# Patient Record
Sex: Male | Born: 1990 | Race: White | Hispanic: No | Marital: Single
Health system: Southern US, Community
[De-identification: ages and names within clinical notes are randomized; demographics above are authoritative.]

## PROBLEM LIST (undated history)

## (undated) HISTORY — PX: TYMPANOSTOMY TUBE PLACEMENT: SHX32

## (undated) HISTORY — PX: WRIST ARTHROSCOPY: SUR100

## (undated) HISTORY — PX: TONSILLECTOMY: SUR1361

## (undated) HISTORY — PX: ADENOIDECTOMY: SUR15

---
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
## 2012-08-26 ENCOUNTER — Emergency Department: Payer: Self-pay | Admitting: Internal Medicine

## 2012-08-26 LAB — URINALYSIS, COMPLETE
Bilirubin,UR: NEGATIVE
Glucose,UR: NEGATIVE mg/dL (ref 0–75)
Ketone: NEGATIVE
Protein: 100
Squamous Epithelial: NONE SEEN
WBC UR: 5 /HPF (ref 0–5)

## 2012-08-26 LAB — CBC
HCT: 45.4 % (ref 40.0–52.0)
MCH: 29.9 pg (ref 26.0–34.0)
MCHC: 35.5 g/dL (ref 32.0–36.0)
MCV: 84 fL (ref 80–100)
Platelet: 240 10*3/uL (ref 150–440)
RBC: 5.39 10*6/uL (ref 4.40–5.90)
RDW: 13.4 % (ref 11.5–14.5)
WBC: 10.3 10*3/uL (ref 3.8–10.6)

## 2012-08-26 LAB — COMPREHENSIVE METABOLIC PANEL
Alkaline Phosphatase: 116 U/L (ref 98–317)
Anion Gap: 5 — ABNORMAL LOW (ref 7–16)
Bilirubin,Total: 0.7 mg/dL (ref 0.2–1.0)
Chloride: 107 mmol/L (ref 98–107)
Co2: 28 mmol/L (ref 21–32)
EGFR (African American): >60
Glucose: 115 mg/dL — ABNORMAL HIGH (ref 65–99)
Osmolality: 281 (ref 275–301)
Sodium: 140 mmol/L (ref 136–145)
Total Protein: 8 g/dL (ref 6.4–8.6)

## 2013-02-26 ENCOUNTER — Emergency Department: Payer: Self-pay | Admitting: Emergency Medicine

## 2013-02-26 LAB — URINALYSIS, COMPLETE
Bilirubin,UR: NEGATIVE
Ketone: NEGATIVE
Leukocyte Esterase: NEGATIVE
Protein: 100
Specific Gravity: 1.014 (ref 1.003–1.030)
Squamous Epithelial: NONE SEEN
WBC UR: 5 /HPF (ref 0–5)

## 2013-02-26 LAB — BASIC METABOLIC PANEL
Anion Gap: 1 — ABNORMAL LOW (ref 7–16)
BUN: 14 mg/dL (ref 7–18)
Calcium, Total: 9.5 mg/dL (ref 8.5–10.1)
Chloride: 106 mmol/L (ref 98–107)
Co2: 31 mmol/L (ref 21–32)
Creatinine: 0.85 mg/dL (ref 0.60–1.30)
Glucose: 98 mg/dL (ref 65–99)
Potassium: 3.8 mmol/L (ref 3.5–5.1)
Sodium: 138 mmol/L (ref 136–145)

## 2013-02-26 LAB — CBC
HCT: 44.2 % (ref 40.0–52.0)
MCH: 29.9 pg (ref 26.0–34.0)
MCHC: 35.4 g/dL (ref 32.0–36.0)
Platelet: 214 10*3/uL (ref 150–440)
RBC: 5.23 10*6/uL (ref 4.40–5.90)
WBC: 6.6 10*3/uL (ref 3.8–10.6)

## 2013-03-07 ENCOUNTER — Emergency Department: Payer: Self-pay | Admitting: Emergency Medicine

## 2013-03-07 LAB — URINALYSIS, COMPLETE
Leukocyte Esterase: NEGATIVE
Ph: 5 (ref 4.5–8.0)
RBC,UR: 146 /HPF (ref 0–5)
Squamous Epithelial: <1

## 2013-03-07 LAB — CBC
HCT: 41.5 % (ref 40.0–52.0)
HGB: 14.5 g/dL (ref 13.0–18.0)
MCHC: 34.8 g/dL (ref 32.0–36.0)
MCV: 85 fL (ref 80–100)
RBC: 4.91 10*6/uL (ref 4.40–5.90)
WBC: 12.8 10*3/uL — ABNORMAL HIGH (ref 3.8–10.6)

## 2013-08-22 ENCOUNTER — Emergency Department: Payer: Self-pay | Admitting: Emergency Medicine

## 2013-12-14 ENCOUNTER — Emergency Department: Payer: Self-pay | Admitting: Internal Medicine

## 2013-12-14 LAB — CBC
HCT: 44.5 % (ref 40.0–52.0)
HGB: 15.6 g/dL (ref 13.0–18.0)
MCH: 30.3 pg (ref 26.0–34.0)
MCHC: 35 g/dL (ref 32.0–36.0)
MCV: 86 fL (ref 80–100)
Platelet: 235 10*3/uL (ref 150–440)
RBC: 5.15 10*6/uL (ref 4.40–5.90)
RDW: 13 % (ref 11.5–14.5)
WBC: 13.7 10*3/uL — ABNORMAL HIGH (ref 3.8–10.6)

## 2013-12-14 LAB — COMPREHENSIVE METABOLIC PANEL
ALT: 62 U/L
ANION GAP: 9 (ref 7–16)
AST: 47 U/L — AB (ref 15–37)
Albumin: 3.9 g/dL (ref 3.4–5.0)
Alkaline Phosphatase: 95 U/L
BUN: 9 mg/dL (ref 7–18)
Bilirubin,Total: 0.5 mg/dL (ref 0.2–1.0)
CALCIUM: 9 mg/dL (ref 8.5–10.1)
CREATININE: 0.79 mg/dL (ref 0.60–1.30)
Chloride: 107 mmol/L (ref 98–107)
Co2: 23 mmol/L (ref 21–32)
EGFR (African American): >60
Glucose: 177 mg/dL — ABNORMAL HIGH (ref 65–99)
Osmolality: 281 (ref 275–301)
Potassium: 3.6 mmol/L (ref 3.5–5.1)
Sodium: 139 mmol/L (ref 136–145)
TOTAL PROTEIN: 7 g/dL (ref 6.4–8.2)

## 2013-12-14 LAB — LIPASE, BLOOD: LIPASE: 126 U/L (ref 73–393)

## 2013-12-29 ENCOUNTER — Ambulatory Visit: Payer: Self-pay | Admitting: Specialist

## 2014-08-04 ENCOUNTER — Emergency Department: Admit: 2014-08-04 | Discharge status: Against Medical Advice | Payer: Self-pay | Admitting: Emergency Medicine

## 2014-08-04 LAB — CBC WITH DIFFERENTIAL/PLATELET
BASOS ABS: 0 10*3/uL (ref 0.0–0.1)
Basophil %: 0.4 %
Eosinophil #: 0.2 10*3/uL (ref 0.0–0.7)
Eosinophil %: 2.5 %
HCT: 46.2 % (ref 40.0–52.0)
HGB: 15.5 g/dL (ref 13.0–18.0)
LYMPHS PCT: 17.1 %
Lymphocyte #: 1.4 10*3/uL (ref 1.0–3.6)
MCH: 29.7 pg (ref 26.0–34.0)
MCHC: 33.5 g/dL (ref 32.0–36.0)
MCV: 88 fL (ref 80–100)
MONO ABS: 0.9 x10 3/mm (ref 0.2–1.0)
Monocyte %: 10.6 %
NEUTROS PCT: 69.4 %
Neutrophil #: 5.7 10*3/uL (ref 1.4–6.5)
PLATELETS: 190 10*3/uL (ref 150–440)
RBC: 5.22 10*6/uL (ref 4.40–5.90)
RDW: 12.9 % (ref 11.5–14.5)
WBC: 8.2 10*3/uL (ref 3.8–10.6)

## 2014-08-04 LAB — COMPREHENSIVE METABOLIC PANEL
ALBUMIN: 4.5 g/dL
ALT: 40 U/L
AST: 28 U/L
Alkaline Phosphatase: 99 U/L
Anion Gap: 5 — ABNORMAL LOW (ref 7–16)
BUN: 11 mg/dL
Bilirubin,Total: 1 mg/dL
Calcium, Total: 9.4 mg/dL
Chloride: 105 mmol/L
Co2: 28 mmol/L
Creatinine: 0.9 mg/dL
EGFR (Non-African Amer.): >60
Glucose: 113 mg/dL — ABNORMAL HIGH
Potassium: 4.1 mmol/L
SODIUM: 138 mmol/L
TOTAL PROTEIN: 7.4 g/dL

## 2014-08-04 LAB — URINALYSIS, COMPLETE
BILIRUBIN, UR: NEGATIVE
Glucose,UR: NEGATIVE mg/dL (ref 0–75)
Ketone: NEGATIVE
LEUKOCYTE ESTERASE: NEGATIVE
Nitrite: NEGATIVE
PH: 5 (ref 4.5–8.0)
Protein: 100
Specific Gravity: 1.017 (ref 1.003–1.030)
Squamous Epithelial: NONE SEEN

## 2014-08-04 LAB — LIPASE, BLOOD: LIPASE: 25 U/L
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
## 2014-08-06 NOTE — Op Note (Signed)
PATIENT NAME:  David Ward, David Ward MR#:  [id number] DATE OF BIRTH:  [birth year]  DATE OF PROCEDURE:  12/29/2013  PREOPERATIVE DIAGNOSIS: Volar dislocation, left ulna at the radial ulnar joint with fractures of the distal radius and ulnar styloid.   POSTOPERATIVE DIAGNOSIS: Volar dislocation, left ulna at the radial ulnar joint with fractures of the distal radius and ulnar styloid.   OPERATION: Open reduction internal fixation left distal radius ulnar styloid and reduction of the radial ulnar dislocation (one 4.0 32 mm Synthes cannulated screw and one 26 mm 3.0 mm cannulated screw).   SURGEON: Valinda HoarHoward E. Nyasha Rahilly, M.D.   ANESTHESIA: General LMA.   COMPLICATIONS: None.   DRAINS: None.   ESTIMATED BLOOD LOSS: None.   REPLACED: None.   DESCRIPTION OF PROCEDURE: The patient was brought to the Operating Room where he underwent satisfactory general LMA anesthesia in the supine position. The left arm was prepped and draped in sterile fashion. Palpation showed that the distal ulna was displaced volarly and radially. Although the patient had good supination, he had no pronation. The distal radius had been reduced initially in the Emergency Room. A stab wound was made and a guidepin inserted. A 32 mm 4.0 cannulated screw was inserted to stabilize this fracture to prevent displacement during the remainder of the procedure. Once this was completed, a gently curved dorsal ulnar incision was made over the distal radial ulnar joint. Dissection was carried out bluntly out through subcutaneous tissue using loop magnification. Ulnar nerve branches were preserved as well as possible. Dissection was carried out radially and by palpation I could feel the shaft of the ulna, which was displaced volarly and radially. The interval between the radius and ulna proximal to the ulnar joint was opened and a large periosteal elevator was introduced. By levering this with significant force, the distal ulna suddenly popped back into  place with tremendous force. This immediately restored the contour and alignment of the wrist. Pronation and supination were restored nicely. The distal ulna was exposed and a guidepin inserted. A 26 mm 3.0 cannulated screw was inserted to stabilize the ulnar styloid. The radial ulnar joint was quite stable with rotation and anterior and posterior stressing. I did not feel that a pin was necessary through the joint. The wound was irrigated. Subcutaneous tissue was closed with 4-0 Vicryl, and the skin was closed with 4-0 nylon on both wounds. Marcaine 0.5 was placed in the wounds. A dry sterile dressing and a sugar tong splint were applied with the wrist slightly pronated. The tourniquet was deflated with good return of blood flow to the hand. The patient was awakened and taken to recovery in good condition.    ____________________________ Valinda HoarHoward E. Kymari Lollis, MD hem:at D: 12/29/2013 13:33:51 ET T: 12/29/2013 15:06:24 ET JOB#: [id number]  cc: Valinda HoarHoward E. Anona Giovannini, MD, <Dictator> Valinda HoarHOWARD E Jenyfer Trawick MD ELECTRONICALLY SIGNED 12/30/2013 10:31

## 2015-05-26 ENCOUNTER — Encounter: Payer: Self-pay | Admitting: Emergency Medicine

## 2015-05-26 ENCOUNTER — Emergency Department
Admission: EM | Admit: 2015-05-26 | Discharge: 2015-05-26 | Disposition: A | Discharge status: Home / Self Care | Payer: Self-pay | Attending: Emergency Medicine | Admitting: Emergency Medicine

## 2015-05-26 DIAGNOSIS — K529 Noninfective gastroenteritis and colitis, unspecified: Secondary | ICD-10-CM | POA: Insufficient documentation

## 2015-05-26 LAB — COMPREHENSIVE METABOLIC PANEL
ALT: 38 U/L (ref 17–63)
AST: 28 U/L (ref 15–41)
Albumin: 4.7 g/dL (ref 3.5–5.0)
Alkaline Phosphatase: 76 U/L (ref 38–126)
Anion gap: 6 (ref 5–15)
BUN: 12 mg/dL (ref 6–20)
CALCIUM: 9.2 mg/dL (ref 8.9–10.3)
CHLORIDE: 105 mmol/L (ref 101–111)
CO2: 29 mmol/L (ref 22–32)
CREATININE: 0.83 mg/dL (ref 0.61–1.24)
Glucose, Bld: 85 mg/dL (ref 65–99)
Potassium: 3.6 mmol/L (ref 3.5–5.1)
Sodium: 140 mmol/L (ref 135–145)
Total Bilirubin: 0.8 mg/dL (ref 0.3–1.2)
Total Protein: 7.6 g/dL (ref 6.5–8.1)

## 2015-05-26 LAB — CBC WITH DIFFERENTIAL/PLATELET
Basophils Absolute: 0 10*3/uL (ref 0–0.1)
Basophils Relative: 1 %
EOS PCT: 1 %
Eosinophils Absolute: 0.1 10*3/uL (ref 0–0.7)
HEMATOCRIT: 44.7 % (ref 40.0–52.0)
Hemoglobin: 15.2 g/dL (ref 13.0–18.0)
LYMPHS ABS: 3 10*3/uL (ref 1.0–3.6)
LYMPHS PCT: 32 %
MCH: 29.1 pg (ref 26.0–34.0)
MCHC: 34.1 g/dL (ref 32.0–36.0)
MCV: 85.4 fL (ref 80.0–100.0)
MONO ABS: 0.7 10*3/uL (ref 0.2–1.0)
Monocytes Relative: 8 %
NEUTROS ABS: 5.4 10*3/uL (ref 1.4–6.5)
Neutrophils Relative %: 58 %
PLATELETS: 235 10*3/uL (ref 150–440)
RBC: 5.23 MIL/uL (ref 4.40–5.90)
RDW: 13 % (ref 11.5–14.5)
WBC: 9.2 10*3/uL (ref 3.8–10.6)

## 2015-05-26 MED ORDER — ONDANSETRON 4 MG PO TBDP
4.0000 mg | ORAL_TABLET | Freq: Once | ORAL | Status: AC
  Administered 2015-05-26: 4 mg via ORAL
  Filled 2015-05-26: qty 1

## 2015-05-26 MED ORDER — LOPERAMIDE HCL 2 MG PO CAPS
4.0000 mg | ORAL_CAPSULE | Freq: Once | ORAL | Status: AC
  Administered 2015-05-26: 4 mg via ORAL
  Filled 2015-05-26 (×2): qty 2

## 2015-05-26 MED ORDER — PROMETHAZINE HCL 25 MG PO TABS
25.0000 mg | ORAL_TABLET | Freq: Once | ORAL | Status: AC
  Administered 2015-05-26: 25 mg via ORAL
  Filled 2015-05-26: qty 1

## 2015-05-26 MED ORDER — ONDANSETRON HCL 4 MG PO TABS: 4.0000 mg | ORAL_TABLET | Freq: Every day | ORAL | Status: DC | PRN

## 2015-05-26 NOTE — ED Provider Notes (Signed)
[facility] Emergency Department Provider Note     Time seen: ----------------------------------------- 4:59 PM on 05/26/2015 -----------------------------------------    I have reviewed the triage vital signs and the nursing notes.   HISTORY  Chief Complaint Emesis    HPI David Ward is a 25 y.o. male who presents ER with nausea vomiting and diarrhea since Monday. Patient states she really hasn't kept anything down since yesterday, denies any specific abdominal pain, denies history of same. Nothing makes his symptoms better, eating makes his symptoms worse.   History reviewed. No pertinent past medical history.  There are no active problems to display for this patient.   History reviewed. No pertinent past surgical history.  Allergies Review of patient's allergies indicates no known allergies.  Social History Social History  Substance Use Topics  . Smoking status: Never Smoker   . Smokeless tobacco: None  . Alcohol Use: None    Review of Systems Constitutional: Negative for fever. Eyes: Negative for visual changes. ENT: Negative for sore throat. Cardiovascular: Negative for chest pain. Respiratory: Negative for shortness of breath. Gastrointestinal: Positive for abdominal pain, vomiting and diarrhea Genitourinary: Negative for dysuria. Musculoskeletal: Negative for back pain. Skin: Negative for rash. Neurological: Negative for headaches, focal weakness or numbness.  10-point ROS otherwise negative.  ____________________________________________   PHYSICAL EXAM:  VITAL SIGNS: ED Triage Vitals  Enc Vitals Group     BP 05/26/15 1547 143/75 mmHg     Pulse Rate 05/26/15 1547 60     Resp 05/26/15 1547 18     Temp 05/26/15 1547 98.5 F (36.9 C)     Temp Source 05/26/15 1547 Oral     SpO2 05/26/15 1547 99 %     Weight 05/26/15 1547 287 lb (130.182 kg)     Height 05/26/15 1547  (1.803 m)     Head Cir --      Peak  Flow --      Pain Score 05/26/15 1547 5     Pain Loc --      Pain Edu? --      Excl. in GC? --     Constitutional: Alert and oriented. Well appearing and in no distress. Eyes: Conjunctivae are normal. PERRL. Normal extraocular movements. ENT   Head: Normocephalic and atraumatic.   Nose: No congestion/rhinnorhea.   Mouth/Throat: Mucous membranes are moist.   Neck: No stridor. Cardiovascular: Normal rate, regular rhythm. Normal and symmetric distal pulses are present in all extremities. No murmurs, rubs, or gallops. Respiratory: Normal respiratory effort without tachypnea nor retractions. Breath sounds are clear and equal bilaterally. No wheezes/rales/rhonchi. Gastrointestinal: Soft and nontender. No distention. No abdominal bruits.  Musculoskeletal: Nontender with normal range of motion in all extremities. No joint effusions.  No lower extremity tenderness nor edema. Neurologic:  Normal speech and language. No gross focal neurologic deficits are appreciated. Speech is normal. No gait instability. Skin:  Skin is warm, dry and intact. No rash noted. Psychiatric: Mood and affect are normal. Speech and behavior are normal. Patient exhibits appropriate insight and judgment. ____________________________________________  ED COURSE:  Pertinent labs & imaging results that were available during my care of the patient were reviewed by me and considered in my medical decision making (see chart for details). Patient's distress, likely Norovirus. He'll be given oral antiemetics and antidiarrheal agents. ____________________________________________    LABS (pertinent positives/negatives)  Labs Reviewed  CBC WITH DIFFERENTIAL/PLATELET  COMPREHENSIVE METABOLIC PANEL   ____________________________________________  FINAL ASSESSMENT AND PLAN  Gastroenteritis  Plan: Patient with labs and imaging as dictated above. Patient be discharged with antiemetics and encouraged to have close  follow-up with his doctor for reevaluation.   Emily Filbert, MD   Emily Filbert, MD 05/26/15 [phone]

## 2015-05-26 NOTE — ED Notes (Signed)
Reports n/v since Monday.  Skin w/d with good color

## 2015-05-26 NOTE — Discharge Instructions (Signed)
Norovirus Infection °A norovirus infection is caused by exposure to a virus in a group of similar viruses (noroviruses). This type of infection causes inflammation in your stomach and intestines (gastroenteritis). Norovirus is the most common cause of gastroenteritis. It also causes food poisoning. °Anyone can get a norovirus infection. It spreads very easily (contagious). You can get it from contaminated food, water, surfaces, or other people. Norovirus is found in the stool or vomit of infected people. You can spread the infection as soon as you feel sick until 2 weeks after you recover.  °Symptoms usually begin within 2 days after you become infected. Most norovirus symptoms affect the digestive system. °CAUSES °Norovirus infection is caused by contact with norovirus. You can catch norovirus if you: °· Eat or drink something contaminated with norovirus. °· Touch surfaces or objects contaminated with norovirus and then put your hand in your mouth. °· Have direct contact with an infected person who has symptoms. °· Share food, drink, or utensils with someone with who is sick with norovirus. °SIGNS AND SYMPTOMS °Symptoms of norovirus may include: °· Nausea. °· Vomiting. °· Diarrhea. °· Stomach cramps. °· Fever. °· Chills. °· Headache. °· Muscle aches. °· Tiredness. °DIAGNOSIS °Your health care provider may suspect norovirus based on your symptoms and physical exam. Your health care provider may also test a sample of your stool or vomit for the virus.  °TREATMENT °There is no specific treatment for norovirus. Most people get better without treatment in about 2 days. °HOME CARE INSTRUCTIONS °· Replace lost fluids by drinking plenty of water or rehydration fluids containing important minerals called electrolytes. This prevents dehydration. Drink enough fluid to keep your urine clear or pale yellow. °· Do not prepare food for others while you are infected. Wait at least 3 days after recovering from the illness to do  that. °PREVENTION  °· Wash your hands often, especially after using the toilet or changing a diaper. °· Wash fruits and vegetables thoroughly before preparing or serving them. °· Throw out any food that a sick person may have touched. °· Disinfect contaminated surfaces immediately after someone in the household has been sick. Use a bleach-based household cleaner. °· Immediately remove and wash soiled clothes or sheets. °SEEK MEDICAL CARE IF: °· Your vomiting, diarrhea, and stomach pain is getting worse. °· Your symptoms of norovirus do not go away after 2-3 days. °SEEK IMMEDIATE MEDICAL CARE IF:  °You develop symptoms of dehydration that do not improve with fluid replacement. This may include: °· Excessive sleepiness. °· Lack of tears. °· Dry mouth. °· Dizziness when standing. °· Weak pulse. °  °This information is not intended to replace advice given to you by your health care provider. Make sure you discuss any questions you have with your health care provider. °  °Document Released: 06/22/2002 Document Revised: 04/22/2014 Document Reviewed: 09/09/2013 °Elsevier Interactive Patient Education ©2016 [employer]. ° °

## 2015-10-04 ENCOUNTER — Encounter: Payer: Self-pay | Admitting: Occupational Medicine

## 2015-10-04 ENCOUNTER — Emergency Department
Admission: EM | Admit: 2015-10-04 | Discharge: 2015-10-04 | Disposition: A | Discharge status: Home / Self Care | Payer: Self-pay | Attending: Emergency Medicine | Admitting: Emergency Medicine

## 2015-10-04 DIAGNOSIS — L600 Ingrowing nail: Secondary | ICD-10-CM

## 2015-10-04 MED ORDER — MUPIROCIN 2 % EX OINT: TOPICAL_OINTMENT | CUTANEOUS | Status: AC

## 2015-10-04 MED ORDER — OXYCODONE-ACETAMINOPHEN 5-325 MG PO TABS: 1.0000 | ORAL_TABLET | ORAL | Status: DC | PRN

## 2015-10-04 MED ORDER — CEPHALEXIN 500 MG PO CAPS: 500.0000 mg | ORAL_CAPSULE | Freq: Four times a day (QID) | ORAL | Status: AC

## 2015-10-04 NOTE — ED Notes (Signed)
Pt presents with in grown toe nail for a month right great toe. Drainage purulent and foul odor and swelling noted. Pt reports severe pain when applying pressure to walk.

## 2015-10-04 NOTE — ED Notes (Signed)
Discharge instructions reviewed with patient. Questions fielded by this RN. Patient verbalizes understanding of instructions. Patient discharged home in stable condition per Beers PA. No acute distress noted at time of discharge.   

## 2015-10-04 NOTE — ED Provider Notes (Signed)
[facility] Emergency Department Provider Note  ____________________________________________  Time seen: Approximately 9:52 PM  I have reviewed the triage vital signs and the nursing notes.   HISTORY  Chief Complaint Toe Pain    HPI David Ward is a 25 y.o. male who presents for evaluation of ingrown toenail for one month. Patient states that he had some purulent drainage and foul are coming from his right great toenail. Desires referral to podiatry. Voices no other complaints at this time. Denies any trauma or injury.   History reviewed. No pertinent past medical history.  There are no active problems to display for this patient.   Past Surgical History  Procedure Laterality Date  . Tonsillectomy    . Tympanostomy tube placement    . Adenoidectomy    . Wrist arthroscopy      right    Current Outpatient Rx  Name  Route  Sig  Dispense  Refill  . cephALEXin (KEFLEX) 500 MG capsule   Oral   Take 1 capsule (500 mg total) by mouth 4 (four) times daily.   40 capsule   0   . mupirocin ointment (BACTROBAN) 2 %      Apply to affected area 3 times daily   22 g   0   . ondansetron (ZOFRAN) 4 MG tablet   Oral   Take 1 tablet (4 mg total) by mouth daily as needed for nausea or vomiting.   20 tablet   1   . oxyCODONE-acetaminophen (ROXICET) 5-325 MG tablet   Oral   Take 1-2 tablets by mouth every 4 (four) hours as needed for severe pain.   15 tablet   0     Allergies Review of patient's allergies indicates no known allergies.  No family history on file.  Social History Social History  Substance Use Topics  . Smoking status: Never Smoker   . Smokeless tobacco: None  . Alcohol Use: Yes     Comment: occ    Review of Systems Constitutional: No fever/chills Cardiovascular: Denies chest pain. Respiratory: Denies shortness of breath. Musculoskeletal: Negative for back pain. Skin: Positive for erythematous swollen right great  toe on the medial aspect. Neurological: Negative for headaches, focal weakness or numbness.  10-point ROS otherwise negative.  ____________________________________________   PHYSICAL EXAM:  VITAL SIGNS: ED Triage Vitals  Enc Vitals Group     BP 10/04/15 2125 140/70 mmHg     Pulse Rate 10/04/15 2125 83     Resp --      Temp 10/04/15 2125 98.1 F (36.7 C)     Temp Source 10/04/15 2125 Oral     SpO2 10/04/15 2125 97 %     Weight 10/04/15 2125 238 lb (107.956 kg)     Height 10/04/15 2125 5\' 11"  (1.803 m)     Head Cir --      Peak Flow --      Pain Score 10/04/15 2125 6     Pain Loc --      Pain Edu? --      Excl. in GC? --     Constitutional: Alert and oriented. Well appearing and in no acute distress. Cardiovascular: Normal rate, regular rhythm. Grossly normal heart sounds.  Good peripheral circulation. Respiratory: Normal respiratory effort.  No retractions. Lungs CTAB. Musculoskeletal: No lower extremity tenderness nor edema.  No joint effusions. Neurologic:  Normal speech and language. No gross focal neurologic deficits are appreciated. No gait instability. Skin:  Right great toe  with swelling purulent drainage noted on the medial aspect of the toe. Positive tenderness. No active pus coming from the toe. Psychiatric: Mood and affect are normal. Speech and behavior are normal.  ____________________________________________   LABS (all labs ordered are listed, but only abnormal results are displayed)  Labs Reviewed - No data to display ____________________________________________    PROCEDURES  Procedure(s) performed: None  Critical Care performed: No  ____________________________________________   INITIAL IMPRESSION / ASSESSMENT AND PLAN / ED COURSE  Pertinent labs & imaging results that were available during my care of the patient were reviewed by me and considered in my medical decision making (see chart for details).  Ingrown toenail. Rx given for Keflex  500 mg 4 times a day, Bactroban ointment and Percocet for pain. Patient follow-up with Dr. Orland Jarred podiatry on-call. He voices no other emergency medical complaints at this time. ____________________________________________   FINAL CLINICAL IMPRESSION(S) / ED DIAGNOSES  Final diagnoses:  Ingrown right greater toenail     This chart was dictated using voice recognition software/Dragon. Despite best efforts to proofread, errors can occur which can change the meaning. Any change was purely unintentional.   Evangeline Dakin, PA-C 10/04/15 2158  Governor Rooks, MD 10/04/15 2332

## 2015-10-04 NOTE — Discharge Instructions (Signed)
Ingrown Toenail  An ingrown toenail occurs when the corner or sides of your toenail grow into the surrounding skin. The big toe is most commonly affected, but it can happen to any of your toes. If your ingrown toenail is not treated, you will be at risk for infection.  CAUSES  This condition may be caused by:  · Wearing shoes that are too small or tight.  · Injury or trauma, such as stubbing your toe or having your toe stepped on.  · Improper cutting or care of your toenails.  · Being born with (congenital) nail or foot abnormalities, such as having a nail that is too big for your toe.  RISK FACTORS  Risk factors for an ingrown toenail include:  · Age. Your nails tend to thicken as you get older, so ingrown nails are more common in older people.  · Diabetes.  · Cutting your toenails incorrectly.  · Blood circulation problems.  SYMPTOMS  Symptoms may include:  · Pain, soreness, or tenderness.  · Redness.  · Swelling.  · Hardening of the skin surrounding the toe.  Your ingrown toenail may be infected if there is fluid, pus, or drainage.  DIAGNOSIS   An ingrown toenail may be diagnosed by medical history and physical exam. If your toenail is infected, your health care provider may test a sample of the drainage.  TREATMENT  Treatment depends on the severity of your ingrown toenail. Some ingrown toenails may be treated at home. More severe or infected ingrown toenails may require surgery to remove all or part of the nail. Infected ingrown toenails may also be treated with antibiotic medicines.  HOME CARE INSTRUCTIONS  · If you were prescribed an antibiotic medicine, finish all of it even if you start to feel better.  · Soak your foot in warm soapy water for 20 minutes, 3 times per day or as directed by your health care provider.  · Carefully lift the edge of the nail away from the sore skin by wedging a small piece of cotton under the corner of the nail. This may help with the pain.  Be careful not to cause more injury  to the area.  · Wear shoes that fit well. If your ingrown toenail is causing you pain, try wearing sandals, if possible.  · Trim your toenails regularly and carefully. Do not cut them in a curved shape. Cut your toenails straight across. This prevents injury to the skin at the corners of the toenail.  · Keep your feet clean and dry.  · If you are having trouble walking and are given crutches by your health care provider, use them as directed.  · Do not pick at your toenail or try to remove it yourself.  · Take medicines only as directed by your health care provider.  · Keep all follow-up visits as directed by your health care provider. This is important.  SEEK MEDICAL CARE IF:  · Your symptoms do not improve with treatment.  SEEK IMMEDIATE MEDICAL CARE IF:  · You have red streaks that start at your foot and go up your leg.  · You have a fever.  · You have increased redness, swelling, or pain.  · You have fluid, blood, or pus coming from your toenail.     This information is not intended to replace advice given to you by your health care provider. Make sure you discuss any questions you have with your health care provider.     Document Released:   03/29/2000 Document Revised: 08/16/2014 Document Reviewed: 02/23/2014  Elsevier Interactive Patient Education ©2016 [employer]

## 2016-03-12 ENCOUNTER — Encounter: Payer: Self-pay | Admitting: Emergency Medicine

## 2016-03-12 ENCOUNTER — Emergency Department
Admission: EM | Admit: 2016-03-12 | Discharge: 2016-03-12 | Disposition: A | Discharge status: Home / Self Care | Payer: 59 | Attending: Emergency Medicine | Admitting: Emergency Medicine

## 2016-03-12 DIAGNOSIS — R197 Diarrhea, unspecified: Secondary | ICD-10-CM | POA: Insufficient documentation

## 2016-03-12 DIAGNOSIS — J069 Acute upper respiratory infection, unspecified: Secondary | ICD-10-CM | POA: Insufficient documentation

## 2016-03-12 DIAGNOSIS — R05 Cough: Secondary | ICD-10-CM | POA: Diagnosis present

## 2016-03-12 LAB — POCT RAPID STREP A: Streptococcus, Group A Screen (Direct): NEGATIVE

## 2016-03-12 MED ORDER — CETIRIZINE HCL 10 MG PO TABS: 10.0000 mg | ORAL_TABLET | Freq: Every day | ORAL | 0 refills | Status: DC

## 2016-03-12 MED ORDER — PSEUDOEPH-BROMPHEN-DM 30-2-10 MG/5ML PO SYRP: 10.0000 mL | ORAL_SOLUTION | Freq: Four times a day (QID) | ORAL | 0 refills | Status: DC | PRN

## 2016-03-12 MED ORDER — FLUTICASONE PROPIONATE 50 MCG/ACT NA SUSP: 1.0000 | Freq: Two times a day (BID) | NASAL | 0 refills | Status: DC

## 2016-03-12 NOTE — ED Provider Notes (Signed)
[facility] Emergency Department Provider Note  ____________________________________________  Time seen: Approximately 5:41 PM  I have reviewed the triage vital signs and the nursing notes.   HISTORY  Chief Complaint Sore Throat and Nasal Congestion    HPI David Ward is a 25 y.o. male who presents to emergency department for a 5 day history of nasal congestion, sore throat, coughing, diarrhea. Patient states that symptoms began insidiously with nasal congestion and progressed to include sore throat, coughing, and diarrhea. Patient states that he is tried some Tylenol Cold and flu medication with only minimal relief. Patient denies any headache, visual changes, neck pain, chest pain, shortness of breath, abdominal pain, constipation. Patient states that he has had 5 episodes of diarrhea over 2 days. Patient denies any fevers or chills. He denies any blood in diarrhea. No complaints of this time.   History reviewed. No pertinent past medical history.  There are no active problems to display for this patient.   Past Surgical History:  Procedure Laterality Date  . ADENOIDECTOMY    . TONSILLECTOMY    . TYMPANOSTOMY TUBE PLACEMENT    . WRIST ARTHROSCOPY     right    Prior to Admission medications   Medication Sig Start Date End Date Taking? Authorizing Provider  brompheniramine-pseudoephedrine-DM 30-2-10 MG/5ML syrup Take 10 mLs by mouth 4 (four) times daily as needed. 03/12/16   Delorise Royals Lisanne Ponce, PA-C  cetirizine (ZYRTEC) 10 MG tablet Take 1 tablet (10 mg total) by mouth daily. 03/12/16   Delorise Royals Courtny Bennison, PA-C  fluticasone (FLONASE) 50 MCG/ACT nasal spray Place 1 spray into both nostrils 2 (two) times daily. 03/12/16   Delorise Royals Izabell Schalk, PA-C  mupirocin ointment (BACTROBAN) 2 % Apply to affected area 3 times daily 10/04/15 10/03/16  Charmayne Sheer Beers, PA-C  ondansetron (ZOFRAN) 4 MG tablet Take 1 tablet (4 mg total) by mouth daily as needed  for nausea or vomiting. 05/26/15   Emily Filbert, MD  oxyCODONE-acetaminophen (ROXICET) 5-325 MG tablet Take 1-2 tablets by mouth every 4 (four) hours as needed for severe pain. 10/04/15   Evangeline Dakin, PA-C    Allergies Patient has no known allergies.  No family history on file.  Social History Social History  Substance Use Topics  . Smoking status: Never Smoker  . Smokeless tobacco: Never Used  . Alcohol use Yes     Comment: occ     Review of Systems  Constitutional: No fever/chills Eyes: No visual changes. No discharge [id number] for nasal congestion and sore throat Cardiovascular: no chest pain. Respiratory: Positive cough. No SOB. Gastrointestinal: No abdominal pain.  No nausea, no vomiting.  Positive diarrhea.  No constipation. Musculoskeletal: Negative for musculoskeletal pain. Skin: Negative for rash, abrasions, lacerations, ecchymosis. Neurological: Negative for headaches, focal weakness or numbness. 10-point ROS otherwise negative.  ____________________________________________   PHYSICAL EXAM:  VITAL SIGNS: ED Triage Vitals  Enc Vitals Group     BP 03/12/16 1659 (!) 170/83     Pulse Rate 03/12/16 1659 91     Resp 03/12/16 1659 18     Temp 03/12/16 1659 98.3 F (36.8 C)     Temp Source 03/12/16 1659 Oral     SpO2 03/12/16 1659 97 %     Weight 03/12/16 1700 300 lb (136.1 kg)     Height 03/12/16 1700 5\' 11"  (1.803 m)     Head Circumference --      Peak Flow --      Pain Score  03/12/16 1700 2     Pain Loc --      Pain Edu? --      Excl. in GC? --      Constitutional: Alert and oriented. Well appearing and in no acute distress. Eyes: Conjunctivae are normal. PERRL. EOMI. Head: Atraumatic. ENT:      Ears: EACs and TMs are unremarkable bilaterally.      Nose: Positive for clear congestion/rhinnorhea.      Mouth/Throat: Mucous membranes are moist. Oropharynx is mildly erythematous but not edematous. Uvula is midline. Tonsils are mildly  erythematous but nonedematous and no exudates present. Neck: No stridor. Neck is supple with full range of motion Hematological/Lymphatic/Immunilogical: No cervical lymphadenopathy. Cardiovascular: Normal rate, regular rhythm. Normal S1 and S2.  Good peripheral circulation. Respiratory: Normal respiratory effort without tachypnea or retractions. Lungs CTAB. Good air entry to the bases with no decreased or absent breath sounds. Gastrointestinal: Bowel sounds 4 quadrants. Soft and nontender to palpation. No guarding or rigidity. No palpable masses. No distention.  Musculoskeletal: Full range of motion to all extremities. No gross deformities appreciated. Neurologic:  Normal speech and language. No gross focal neurologic deficits are appreciated.  Skin:  Skin is warm, dry and intact. No rash noted. Psychiatric: Mood and affect are normal. Speech and behavior are normal. Patient exhibits appropriate insight and judgement.   ____________________________________________   LABS (all labs ordered are listed, but only abnormal results are displayed)  Labs Reviewed  CULTURE, GROUP A STREP [facility])  POCT RAPID STREP A   ____________________________________________  EKG   ____________________________________________  RADIOLOGY   No results found.  ____________________________________________    PROCEDURES  Procedure(s) performed:    Procedures    Medications - No data to display   ____________________________________________   INITIAL IMPRESSION / ASSESSMENT AND PLAN / ED COURSE  Pertinent labs & imaging results that were available during my care of the patient were reviewed by me and considered in my medical decision making (see chart for details).  Review of the [place] CSRS was performed in accordance of the [facility] prior to dispensing any controlled drugs.  Clinical Course     Patient's diagnosis is consistent with Probable respiratory infection with diarrhea. Patient had  a negative strep test emergency department. Exam is reassuring. A shunt has symptoms consistent with viral illness. At this time, there is no indication for further labs or imaging.. Patient will be discharged home with prescriptions for Flonase, Zyrtec, cough syrup for symptomatic control. Patient is to follow up with primary care as needed or otherwise directed. Patient is given ED precautions to return to the ED for any worsening or new symptoms.     ____________________________________________  FINAL CLINICAL IMPRESSION(S) / ED DIAGNOSES  Final diagnoses:  Viral upper respiratory tract infection  Diarrhea, unspecified type      NEW MEDICATIONS STARTED DURING THIS VISIT:  New Prescriptions   BROMPHENIRAMINE-PSEUDOEPHEDRINE-DM 30-2-10 MG/5ML SYRUP    Take 10 mLs by mouth 4 (four) times daily as needed.   CETIRIZINE (ZYRTEC) 10 MG TABLET    Take 1 tablet (10 mg total) by mouth daily.   FLUTICASONE (FLONASE) 50 MCG/ACT NASAL SPRAY    Place 1 spray into both nostrils 2 (two) times daily.        This chart was dictated using voice recognition software/Dragon. Despite best efforts to proofread, errors can occur which can change the meaning. Any change was purely unintentional.    Racheal PatchesJonathan D Mairyn Lenahan, PA-C 03/12/16 1755    Phineas SemenGraydon Goodman,  MD 03/12/16 [id number]

## 2016-03-12 NOTE — ED Triage Notes (Signed)
Patient presents to the ED with cough, congestion, sneezing, and sore throat.  Patient states, "when I sneeze, my whole body shudders and my throat really hurts."  Patient reports hoarse voice yesterday.  Patient's breathing is even and not labored at this time.  Patient is in no obvious distress at this time.

## 2016-03-12 NOTE — ED Notes (Signed)
Pt reports sinus congestion with sore throat since Thursday - afebrile - denies nausea - vomited 1 time in 24 hours - 5 loose stools in the last 24 hours - c/o sinus pressure and headache assoc. With the pressure

## 2016-03-15 LAB — CULTURE, GROUP A STREP (THRC)
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
## 2017-04-27 ENCOUNTER — Other Ambulatory Visit: Payer: Self-pay

## 2017-04-27 ENCOUNTER — Emergency Department
Admission: EM | Admit: 2017-04-27 | Discharge: 2017-04-27 | Disposition: A | Discharge status: Home / Self Care | Payer: 59 | Attending: Emergency Medicine | Admitting: Emergency Medicine

## 2017-04-27 ENCOUNTER — Encounter: Payer: Self-pay | Admitting: Emergency Medicine

## 2017-04-27 DIAGNOSIS — R112 Nausea with vomiting, unspecified: Secondary | ICD-10-CM

## 2017-04-27 DIAGNOSIS — R197 Diarrhea, unspecified: Secondary | ICD-10-CM | POA: Insufficient documentation

## 2017-04-27 DIAGNOSIS — R109 Unspecified abdominal pain: Secondary | ICD-10-CM | POA: Insufficient documentation

## 2017-04-27 LAB — COMPREHENSIVE METABOLIC PANEL
ALT: 46 U/L (ref 17–63)
AST: 29 U/L (ref 15–41)
Albumin: 4.5 g/dL (ref 3.5–5.0)
Alkaline Phosphatase: 84 U/L (ref 38–126)
Anion gap: 9 (ref 5–15)
BILIRUBIN TOTAL: 0.8 mg/dL (ref 0.3–1.2)
BUN: 8 mg/dL (ref 6–20)
CALCIUM: 9.7 mg/dL (ref 8.9–10.3)
CO2: 27 mmol/L (ref 22–32)
Chloride: 104 mmol/L (ref 101–111)
Creatinine, Ser: 0.69 mg/dL (ref 0.61–1.24)
GFR calc Af Amer: >60 mL/min (ref >60)
Glucose, Bld: 125 mg/dL — ABNORMAL HIGH (ref 65–99)
POTASSIUM: 3.8 mmol/L (ref 3.5–5.1)
Sodium: 140 mmol/L (ref 135–145)
TOTAL PROTEIN: 7.7 g/dL (ref 6.5–8.1)

## 2017-04-27 LAB — LIPASE, BLOOD: Lipase: 32 U/L (ref 11–51)

## 2017-04-27 LAB — URINALYSIS, COMPLETE (UACMP) WITH MICROSCOPIC
BACTERIA UA: NONE SEEN
BILIRUBIN URINE: NEGATIVE
GLUCOSE, UA: NEGATIVE mg/dL
Hgb urine dipstick: NEGATIVE
Ketones, ur: NEGATIVE mg/dL
LEUKOCYTES UA: NEGATIVE
NITRITE: NEGATIVE
Protein, ur: NEGATIVE mg/dL
SPECIFIC GRAVITY, URINE: 1.014 (ref 1.005–1.030)
Squamous Epithelial / LPF: NONE SEEN
pH: 6 (ref 5.0–8.0)

## 2017-04-27 LAB — GASTROINTESTINAL PANEL BY PCR, STOOL (REPLACES STOOL CULTURE)

## 2017-04-27 LAB — CBC
HEMATOCRIT: 46 % (ref 40.0–52.0)
Hemoglobin: 15.8 g/dL (ref 13.0–18.0)
MCH: 29.5 pg (ref 26.0–34.0)
MCHC: 34.3 g/dL (ref 32.0–36.0)
MCV: 86 fL (ref 80.0–100.0)
Platelets: 226 10*3/uL (ref 150–440)
RBC: 5.34 MIL/uL (ref 4.40–5.90)
RDW: 13.5 % (ref 11.5–14.5)
WBC: 10.1 10*3/uL (ref 3.8–10.6)

## 2017-04-27 LAB — C DIFFICILE QUICK SCREEN W PCR REFLEX
C Diff antigen: NEGATIVE
C Diff interpretation: NOT DETECTED
C Diff toxin: NEGATIVE

## 2017-04-27 LAB — INFLUENZA PANEL BY PCR (TYPE A & B)
INFLAPCR: NEGATIVE
Influenza B By PCR: NEGATIVE

## 2017-04-27 MED ORDER — DIPHENOXYLATE-ATROPINE 2.5-0.025 MG PO TABS
1.0000 | ORAL_TABLET | Freq: Once | ORAL | Status: AC
  Administered 2017-04-27: 1 via ORAL
  Filled 2017-04-27: qty 1

## 2017-04-27 MED ORDER — ONDANSETRON 4 MG PO TBDP: 4.0000 mg | ORAL_TABLET | Freq: Three times a day (TID) | ORAL | 0 refills | Status: DC | PRN

## 2017-04-27 MED ORDER — ONDANSETRON 4 MG PO TBDP
4.0000 mg | ORAL_TABLET | Freq: Once | ORAL | Status: AC
  Administered 2017-04-27: 4 mg via ORAL
  Filled 2017-04-27: qty 1

## 2017-04-27 NOTE — ED Provider Notes (Signed)
[facility] Emergency Department Provider Note  ____________________________________________  Time seen: Approximately 6:56 PM  I have reviewed the triage vital signs and the nursing notes.   HISTORY  Chief Complaint Nausea; Emesis; and Diarrhea    HPI David Ward is a 27 y.o. male, otherwise healthy, presenting with nausea vomiting and diarrhea, abdominal cramping.  The patient reports that for the past week, he has had nonbloody, occasionally watery but mostly loose stool.  This can be associated with diffuse nonfocal cramping.  Today, the patient has had at least 8 episodes.  Over the past week, he is also had multiple episodes of nausea and vomiting, although he has not had any today and has been able to tolerate water without difficulty.  The patient has not had any documented fevers, but has woken up with shaking chills and sweatiness at night.  He has not had any cough or weight loss.  He has not had any travel outside the [place], or been camping, no recent antibiotic use.  He has not tried anything for his symptoms.  History reviewed. No pertinent past medical history.  There are no active problems to display for this patient.   Past Surgical History:  Procedure Laterality Date  . ADENOIDECTOMY    . TONSILLECTOMY    . TYMPANOSTOMY TUBE PLACEMENT    . WRIST ARTHROSCOPY     right    Current Outpatient Rx  . Order #: [id number] Class: Print  . Order #: [id number] Class: Print  . Order #: [id number] Class: Print  . Order #: [id number] Class: Print  . Order #: [id number] Class: Print  . Order #: [id number] Class: Print    Allergies Tramadol  History reviewed. No pertinent family history.  Social History Social History   Tobacco Use  . Smoking status: Never Smoker  . Smokeless tobacco: Never Used  Substance Use Topics  . Alcohol use: Yes    Comment: occ  . Drug use: No    Review of Systems Constitutional: Positive sweatiness with  chills.  Positive general malaise. Eyes: No visual changes.  No eye discharge. ENT: No sore throat. No congestion or rhinorrhea. Cardiovascular: Denies chest pain. Denies palpitations. Respiratory: Denies shortness of breath.  No cough. Gastrointestinal: Diffuse nonfocal abdominal cramping.  No nausea, no vomiting.  Positive diarrhea.  No constipation. Genitourinary: Negative for dysuria.  No urinary frequency. Musculoskeletal: Negative for back pain. Skin: Negative for rash. Neurological: Negative for headaches. No focal numbness, tingling or weakness.     ____________________________________________   PHYSICAL EXAM:  VITAL SIGNS: ED Triage Vitals  Enc Vitals Group     BP 04/27/17 1655 (!) 146/78     Pulse Rate 04/27/17 1655 66     Resp 04/27/17 1655 16     Temp 04/27/17 1655 98.9 F (37.2 C)     Temp Source 04/27/17 1655 Oral     SpO2 04/27/17 1655 96 %     Weight 04/27/17 1656 300 lb (136.1 kg)     Height 04/27/17 1656 6' (1.829 m)     Head Circumference --      Peak Flow --      Pain Score --      Pain Loc --      Pain Edu? --      Excl. in GC? --     Constitutional: Alert and oriented. Well appearing and in no acute distress. Answers questions appropriately. Eyes: Conjunctivae are normal.  EOMI. No scleral icterus. Head: Atraumatic. Nose: No congestion/rhinnorhea. Mouth/Throat: Mucous  membranes are dry.  Neck: No stridor.  Supple.  No JVD.  No meningismus. Cardiovascular: Normal rate, regular rhythm. No murmurs, rubs or gallops.  Respiratory: Normal respiratory effort.  No accessory muscle use or retractions. Lungs CTAB.  No wheezes, rales or ronchi. Gastrointestinal: Soft, and nondistended.  Morbidly obese.  No tenderness to palpation on my examination.  No guarding or rebound.  No peritoneal signs. Musculoskeletal: No LE edema. Neurologic:  A&Ox3.  Speech is clear.  Face and smile are symmetric.  EOMI.  Moves all extremities well. Skin:  Skin is warm, dry and  intact. No rash noted. Psychiatric: Mood and affect are normal. Speech and behavior are normal.  Normal judgement.  ____________________________________________   LABS (all labs ordered are listed, but only abnormal results are displayed)  Labs Reviewed  COMPREHENSIVE METABOLIC PANEL - Abnormal; Notable for the following components:      Result Value   Glucose, Bld 125 (*)    All other components within normal limits  URINALYSIS, COMPLETE (UACMP) WITH MICROSCOPIC - Abnormal; Notable for the following components:   Color, Urine YELLOW (*)    APPearance CLEAR (*)    All other components within normal limits  C DIFFICILE QUICK SCREEN W PCR REFLEX  GASTROINTESTINAL PANEL BY PCR, STOOL (REPLACES STOOL CULTURE)  LIPASE, BLOOD  CBC  INFLUENZA PANEL BY PCR (TYPE A & B)   ____________________________________________  EKG  Not indicated ____________________________________________  RADIOLOGY  No results found.  ____________________________________________   PROCEDURES  Procedure(s) performed: None  Procedures  Critical Care performed: No ____________________________________________   INITIAL IMPRESSION / ASSESSMENT AND PLAN / ED COURSE  Pertinent labs & imaging results that were available during my care of the patient were reviewed by me and considered in my medical decision making (see chart for details).  27 y.o. male, with obesity, presenting with a week of nausea vomiting and diarrhea.  Overall, the patient is hemodynamically stable with a reassuring examination.  I do not see any focal findings in the abdomen, and an acute surgical pathology including appendicitis, or infectious etiology such as diverticulitis is unlikely.  The patient may have a viral syndrome.  We will check him for influenza.  Despite the vomiting and diarrhea, the patient does not have any acute electrolyte disturbances, other than a nonfasting hyperglycemia.  His lipase is also normal.  His white  blood cell count is normal and his urinalysis does not show infection.  At this time, we will plan to treat the patient symptomatically, and I am awaiting the results of his influenza testing.  Plan reevaluation for final disposition  ----------------------------------------- 9:27 PM on 04/27/2017 -----------------------------------------  The patient is feeling significantly better at this time.  He did have one episode of loose stool here, but it was still mostly formed.  His stool studies have not resulted in at this time he prefers to go home then to wait for the results.  He will make an appoint with a primary care physician in the next 1-2 days to get the results.  He understands that he should not take any antidiarrheals until he has the results of those studies.  He will be discharged home with Zofran.  His influenza testing today is negative.  I have talked to him about handwashing, return precautions and follow-up instructions.  ____________________________________________  FINAL CLINICAL IMPRESSION(S) / ED DIAGNOSES  Final diagnoses:  Nausea vomiting and diarrhea  Abdominal cramping         NEW MEDICATIONS STARTED DURING THIS  VISIT:  New Prescriptions   ONDANSETRON (ZOFRAN ODT) 4 MG DISINTEGRATING TABLET    Take 1 tablet (4 mg total) by mouth every 8 (eight) hours as needed for nausea or vomiting.      Rockne MenghiniNorman, Anne-Caroline, MD 04/27/17 2128

## 2017-04-27 NOTE — ED Notes (Signed)
Pt reports N/V/D. Pt states that symptoms have been on and off for the past 5-6 days. Pt states that he has not vomited in the last 24 hours but has had about 15 episodes of diarrhea. Pt states that he has been going the bathroom on and off since 0300.  Pt also reports chills and generalized body aches. Pt states that 3 people at this work has been diagnosed with flu recently. Pt reports waking up in the middle of the night sweating but he is very cold. Pt denies any blood in his stools. Pt in NAD at this time.

## 2017-04-27 NOTE — ED Triage Notes (Signed)
Pt c/o N/V/D x 1 week. Pt states has been out of work x 3 days. Pt states yesterday started feeling better but yesterday night began to feel worse again. Pt denies any blood in vomit or stool. Pt is visualized in NAD at this time.

## 2017-04-27 NOTE — ED Triage Notes (Signed)
First Nurse Note:  Arrives with C/O N/V/D x 6 days.  Patient is AAOx3.  Skin warm and dry.  MM Moist.  NAD.  Ambulates with easy and steady gait.

## 2017-04-27 NOTE — ED Notes (Signed)
Pt given ice water approximately 30 minutes ago. At this time, pt has kept down water without nausea.

## 2017-04-27 NOTE — Discharge Instructions (Signed)
Please take a clear liquid diet for the next 24-48 hours, then advance to a bland diet as tolerated.  You may take Zofran for nausea.  Please avoid antidiarrheal medicines until you have all the results of your stool studies.  Please make an appointment with a primary care physician in the next 1-2 days; they can give you the results of your stool studies.  Return to the emergency department if you develop severe pain, fever, inability to keep down fluids, lightheadedness or fainting, or any other symptoms concerning to you.

## 2017-05-23 ENCOUNTER — Emergency Department
Admission: EM | Admit: 2017-05-23 | Discharge: 2017-05-23 | Disposition: A | Discharge status: Home / Self Care | Payer: Self-pay | Attending: Emergency Medicine | Admitting: Emergency Medicine

## 2017-05-23 ENCOUNTER — Other Ambulatory Visit: Payer: Self-pay

## 2017-05-23 ENCOUNTER — Emergency Department: Payer: Self-pay

## 2017-05-23 ENCOUNTER — Encounter: Payer: Self-pay | Admitting: Emergency Medicine

## 2017-05-23 DIAGNOSIS — Z79899 Other long term (current) drug therapy: Secondary | ICD-10-CM | POA: Insufficient documentation

## 2017-05-23 DIAGNOSIS — R1110 Vomiting, unspecified: Secondary | ICD-10-CM | POA: Insufficient documentation

## 2017-05-23 DIAGNOSIS — R197 Diarrhea, unspecified: Secondary | ICD-10-CM | POA: Insufficient documentation

## 2017-05-23 LAB — URINALYSIS, COMPLETE (UACMP) WITH MICROSCOPIC
BILIRUBIN URINE: NEGATIVE
Bacteria, UA: NONE SEEN
Glucose, UA: NEGATIVE mg/dL
HGB URINE DIPSTICK: NEGATIVE
KETONES UR: NEGATIVE mg/dL
Leukocytes, UA: NEGATIVE
Nitrite: NEGATIVE
PROTEIN: NEGATIVE mg/dL
Specific Gravity, Urine: 1.01 (ref 1.005–1.030)
Squamous Epithelial / LPF: NONE SEEN
pH: 6 (ref 5.0–8.0)

## 2017-05-23 LAB — COMPREHENSIVE METABOLIC PANEL
ALT: 41 U/L (ref 17–63)
ANION GAP: 7 (ref 5–15)
AST: 26 U/L (ref 15–41)
Albumin: 4.3 g/dL (ref 3.5–5.0)
Alkaline Phosphatase: 77 U/L (ref 38–126)
BUN: 9 mg/dL (ref 6–20)
CALCIUM: 9 mg/dL (ref 8.9–10.3)
CHLORIDE: 108 mmol/L (ref 101–111)
CO2: 25 mmol/L (ref 22–32)
Creatinine, Ser: 0.7 mg/dL (ref 0.61–1.24)
GFR calc non Af Amer: >60 mL/min (ref >60)
Glucose, Bld: 106 mg/dL — ABNORMAL HIGH (ref 65–99)
Potassium: 3.8 mmol/L (ref 3.5–5.1)
SODIUM: 140 mmol/L (ref 135–145)
Total Bilirubin: <0.1 mg/dL — ABNORMAL LOW (ref 0.3–1.2)
Total Protein: 7.3 g/dL (ref 6.5–8.1)

## 2017-05-23 LAB — CBC
HCT: 43.3 % (ref 40.0–52.0)
HEMOGLOBIN: 15.1 g/dL (ref 13.0–18.0)
MCH: 30.1 pg (ref 26.0–34.0)
MCHC: 34.8 g/dL (ref 32.0–36.0)
MCV: 86.7 fL (ref 80.0–100.0)
Platelets: 224 10*3/uL (ref 150–440)
RBC: 5 MIL/uL (ref 4.40–5.90)
RDW: 13.3 % (ref 11.5–14.5)
WBC: 7.8 10*3/uL (ref 3.8–10.6)

## 2017-05-23 LAB — LIPASE, BLOOD: LIPASE: 39 U/L (ref 11–51)

## 2017-05-23 MED ORDER — ONDANSETRON 4 MG PO TBDP: 4.0000 mg | ORAL_TABLET | Freq: Three times a day (TID) | ORAL | 0 refills | Status: DC | PRN

## 2017-05-23 MED ORDER — IOPAMIDOL (ISOVUE-300) INJECTION 61%
30.0000 mL | Freq: Once | INTRAVENOUS | Status: AC | PRN
  Administered 2017-05-23: 30 mL via ORAL

## 2017-05-23 MED ORDER — IOPAMIDOL (ISOVUE-300) INJECTION 61%
100.0000 mL | Freq: Once | INTRAVENOUS | Status: AC | PRN
  Administered 2017-05-23: 100 mL via INTRAVENOUS

## 2017-05-23 NOTE — ED Triage Notes (Addendum)
Patient ambulatory to triage with steady gait, without difficulty or distress noted; pt reports "everytime I eat I get sick for the last 2 years"; seen for same and dx with ?viral; no pain

## 2017-05-23 NOTE — ED Provider Notes (Signed)
[facility] Emergency Department Provider Note   ____________________________________________   First MD Initiated Contact with Patient 05/23/17 [phone]     (approximate)  I have reviewed the triage vital signs and the nursing notes.   HISTORY  Chief Complaint Emesis    HPI David Ward is a 27 y.o. male Who reports she's had vomiting or having his mouth open fluid every time he eats. Been going on for many years. He is also having frequent episodes of diarrhea. He is not having any fever now. He is not having any cramps now although he did last time he visited. C. difficile was negative last time. He is not losing weight. He says he's been off from work so much that he is being threatened with being fired.he does smoke some marijuana but has only started that the last 6 months or so. It is not increasing in the amount. He says he is not smoking much of it.   History reviewed. No pertinent past medical history.  There are no active problems to display for this patient.   Past Surgical History:  Procedure Laterality Date  . ADENOIDECTOMY    . TONSILLECTOMY    . TYMPANOSTOMY TUBE PLACEMENT    . WRIST ARTHROSCOPY     right    Prior to Admission medications   Medication Sig Start Date End Date Taking? Authorizing Provider  brompheniramine-pseudoephedrine-DM 30-2-10 MG/5ML syrup Take 10 mLs by mouth 4 (four) times daily as needed. Patient not taking: Reported on 05/23/2017 03/12/16   Cuthriell, Delorise Royals, PA-C  cetirizine (ZYRTEC) 10 MG tablet Take 1 tablet (10 mg total) by mouth daily. Patient not taking: Reported on 05/23/2017 03/12/16   Cuthriell, Delorise Royals, PA-C  fluticasone (FLONASE) 50 MCG/ACT nasal spray Place 1 spray into both nostrils 2 (two) times daily. 03/12/16   Cuthriell, Delorise Royals, PA-C  ondansetron (ZOFRAN ODT) 4 MG disintegrating tablet Take 1 tablet (4 mg total) by mouth every 8 (eight) hours as needed for nausea or  vomiting. Patient not taking: Reported on 05/23/2017 04/27/17   Rockne Menghini, MD  ondansetron (ZOFRAN ODT) 4 MG disintegrating tablet Take 1 tablet (4 mg total) by mouth every 8 (eight) hours as needed for nausea or vomiting. 05/23/17   Arnaldo Natal, MD  ondansetron (ZOFRAN) 4 MG tablet Take 1 tablet (4 mg total) by mouth daily as needed for nausea or vomiting. 05/26/15   Emily Filbert, MD  oxyCODONE-acetaminophen (ROXICET) 5-325 MG tablet Take 1-2 tablets by mouth every 4 (four) hours as needed for severe pain. Patient not taking: Reported on 05/23/2017 10/04/15   Evangeline Dakin, PA-C    Allergies Tramadol  History reviewed. No pertinent family history.  Social History Social History   Tobacco Use  . Smoking status: Never Smoker  . Smokeless tobacco: Never Used  Substance Use Topics  . Alcohol use: Yes    Comment: occ  . Drug use: No    Review of Systems  Constitutional: No fever/chills Eyes: No visual changes. ENT: No sore throat. Cardiovascular: Denies chest pain. Respiratory: Denies shortness of breath. Gastrointestinal: No abdominal pain.   nausea,  vomiting.   diarrhea.  No constipation. Genitourinary: Negative for dysuria. Musculoskeletal: Negative for back pain. Skin: Negative for rash. Neurological: Negative for headaches, focal weakness   ____________________________________________   PHYSICAL EXAM:  VITAL SIGNS: ED Triage Vitals  Enc Vitals Group     BP 05/23/17 0615 (!) 161/92     Pulse Rate 05/23/17  0615 86     Resp 05/23/17 0615 20     Temp 05/23/17 0615 98.7 F (37.1 C)     Temp Source 05/23/17 0615 Oral     SpO2 05/23/17 0615 98 %     Weight 05/23/17 0615 300 lb (136.1 kg)     Height 05/23/17 0615 5\' 11"  (1.803 m)     Head Circumference --      Peak Flow --      Pain Score 05/23/17 0614 0     Pain Loc --      Pain Edu? --      Excl. in GC? --     Constitutional: Alert and oriented. Well appearing and in no acute  distress. Eyes: Conjunctivae are normal.  Head: Atraumatic. Nose: No congestion/rhinnorhea. Mouth/Throat: Mucous membranes are moist.  Oropharynx non-erythematous. Neck: No stridor.   Cardiovascular: Normal rate, regular rhythm. Grossly normal heart sounds.  Good peripheral circulation. Respiratory: Normal respiratory effort.  No retractions. Lungs CTAB. Gastrointestinal: Soft and nontender. No distention. No abdominal bruits. No CVA tenderness. Musculoskeletal: No lower extremity tenderness nor edema.  No joint effusions. Neurologic:  Normal speech and language. No gross focal neurologic deficits are appreciated. No gait instability. Skin:  Skin is warm, dry and intact. No rash noted. Psychiatric: Mood and affect are normal. Speech and behavior are normal.  ____________________________________________   LABS (all labs ordered are listed, but only abnormal results are displayed)  Labs Reviewed  COMPREHENSIVE METABOLIC PANEL - Abnormal; Notable for the following components:      Result Value   Glucose, Bld 106 (*)    Total Bilirubin <0.1 (*)    All other components within normal limits  URINALYSIS, COMPLETE (UACMP) WITH MICROSCOPIC - Abnormal; Notable for the following components:   Color, Urine STRAW (*)    APPearance CLEAR (*)    All other components within normal limits  GASTROINTESTINAL PANEL BY PCR, STOOL (REPLACES STOOL CULTURE)  LIPASE, BLOOD  CBC   ____________________________________________  EKG   ____________________________________________  RADIOLOGY  ED MD interpretation:    Official radiology report(s): Ct Abdomen Pelvis W Contrast  Result Date: 05/23/2017 CLINICAL DATA:  Nausea, vomiting EXAM: CT ABDOMEN AND PELVIS WITH CONTRAST TECHNIQUE: Multidetector CT imaging of the abdomen and pelvis was performed using the standard protocol following bolus administration of intravenous contrast. CONTRAST:  ISOVUE-300 IOPAMIDOL (ISOVUE-300) INJECTION 61%  COMPARISON:  12/14/2013 FINDINGS: Lower chest: Lung bases are clear. No effusions. Heart is normal size. Hepatobiliary: Diffuse low-density throughout the liver compatible with fatty infiltration. No focal abnormality. Gallbladder unremarkable. Pancreas: No focal abnormality or ductal dilatation. Spleen: No focal abnormality.  Normal size. Adrenals/Urinary Tract: No adrenal abnormality. No focal renal abnormality. Punctate nonobstructing stone in the lower pole of the left kidney. No hydronephrosis. Urinary bladder is unremarkable. Stomach/Bowel: Normal appendix. Stomach, large and small bowel grossly unremarkable. Vascular/Lymphatic: No evidence of aneurysm or adenopathy. Reproductive: No visible focal abnormality. Other: No free fluid or free air. Musculoskeletal: No acute bony abnormality. IMPRESSION: Punctate nonobstructing left lower pole renal stone. No hydronephrosis. Fatty infiltration of the liver. Normal appendix. No acute findings. Electronically Signed   By: Charlett Nose M.D.   On: 05/23/2017 09:45    ____________________________________________   PROCEDURES  Procedure(s) performed:   Procedures  Critical Care performed:   ____________________________________________   INITIAL IMPRESSION / ASSESSMENT AND PLAN / ED COURSE          ____________________________________________   FINAL CLINICAL IMPRESSION(S) / ED DIAGNOSES  Final  diagnoses:  Vomiting and diarrhea     ED Discharge Orders        Ordered    ondansetron (ZOFRAN ODT) 4 MG disintegrating tablet  Every 8 hours PRN     05/23/17 1016       Note:  This document was prepared using Dragon voice recognition software and may include unintentional dictation errors.    Arnaldo NatalMalinda, Angeleen Horney F, MD 05/23/17 [id number]

## 2017-05-23 NOTE — Discharge Instructions (Signed)
use the Zofran melt on your tongue 13 times a day as needed. Follow-up with gastroenterology. Given the call you get home and set up an appointment. They should go to help you. lab work and CT did not show any reason for the vomiting.

## 2017-05-23 NOTE — ED Notes (Signed)
Patient transported to CT 

## 2017-05-23 NOTE — ED Notes (Signed)

## 2018-09-28 ENCOUNTER — Telehealth: Payer: Self-pay | Admitting: *Deleted

## 2018-09-28 DIAGNOSIS — Z20822 Contact with and (suspected) exposure to covid-19: Secondary | ICD-10-CM

## 2018-09-28 NOTE — Telephone Encounter (Signed)
[facility] Dept- request COVID testing  Exposure to COVID

## 2018-09-29 ENCOUNTER — Other Ambulatory Visit: Payer: Self-pay

## 2018-09-29 DIAGNOSIS — Z20822 Contact with and (suspected) exposure to covid-19: Secondary | ICD-10-CM

## 2018-10-01 LAB — NOVEL CORONAVIRUS, NAA: SARS-CoV-2, NAA: NOT DETECTED

## 2019-05-24 ENCOUNTER — Ambulatory Visit: Payer: Self-pay | Attending: Internal Medicine

## 2019-07-01 ENCOUNTER — Ambulatory Visit: Payer: Self-pay | Attending: Internal Medicine

## 2019-07-01 DIAGNOSIS — Z20822 Contact with and (suspected) exposure to covid-19: Secondary | ICD-10-CM | POA: Insufficient documentation

## 2019-07-03 LAB — NOVEL CORONAVIRUS, NAA: SARS-CoV-2, NAA: NOT DETECTED

## 2020-04-17 ENCOUNTER — Other Ambulatory Visit: Payer: Self-pay

## 2020-04-17 DIAGNOSIS — Z20822 Contact with and (suspected) exposure to covid-19: Secondary | ICD-10-CM

## 2020-04-18 LAB — SARS-COV-2, NAA 2 DAY TAT

## 2020-04-18 LAB — NOVEL CORONAVIRUS, NAA: SARS-CoV-2, NAA: NOT DETECTED

## 2022-03-12 ENCOUNTER — Other Ambulatory Visit: Payer: Self-pay

## 2022-03-12 ENCOUNTER — Ambulatory Visit
Admission: RE | Admit: 2022-03-12 | Discharge: 2022-03-12 | Disposition: A | Discharge status: Home / Self Care | Payer: Self-pay | Source: Ambulatory Visit | Attending: Emergency Medicine | Admitting: Emergency Medicine

## 2022-03-12 VITALS — BP 135/78 | HR 63 | Temp 98.1°F | Resp 18

## 2022-03-12 DIAGNOSIS — H9193 Unspecified hearing loss, bilateral: Secondary | ICD-10-CM

## 2022-03-12 DIAGNOSIS — H6993 Unspecified Eustachian tube disorder, bilateral: Secondary | ICD-10-CM

## 2022-03-12 DIAGNOSIS — H66002 Acute suppurative otitis media without spontaneous rupture of ear drum, left ear: Secondary | ICD-10-CM

## 2022-03-12 MED ORDER — AMOXICILLIN-POT CLAVULANATE 875-125 MG PO TABS: 1.0000 | ORAL_TABLET | Freq: Two times a day (BID) | ORAL | 0 refills | Status: AC

## 2022-03-12 MED ORDER — FLUTICASONE PROPIONATE 50 MCG/ACT NA SUSP: 2.0000 | Freq: Every day | NASAL | 0 refills | Status: AC

## 2022-03-12 NOTE — ED Triage Notes (Signed)
Patient complains of left ear with muffled hearing, right ear is muffled, but not as bad as left.  Prior to all this had a head cold.  Sunday is onset of hearing.  Patient denies placing anything in either ear

## 2022-03-12 NOTE — ED Provider Notes (Signed)
HPI  SUBJECTIVE:  David Ward is a 31 y.o. male who presents with bilateral decreased hearing starting a week and a half ago.  He reports the upper respiratory symptoms with rhinorrhea, nasal congestion, sore throat for over 2 weeks.  He states that his left ear "closed up" 1.5 weeks ago.  He had pain initially, but this has resolved.  Reports persistent pressure.  States he cannot hear anything out of this ear.  No otorrhea, foreign body insertion.  He states that his right ear has become muffled over the past 3 days with popping.  His hearing improves after his ear pops.  No ear pain, otorrhea.  No fevers.  He has had intermittent episodes of dizziness described as vertigo, which has also resolved.  He reports persistent yellow nasal congestion.  He has been doing warm compresses, and keeping moisture out of his years.  Symptoms are worse with lying on his left side.  He has a past medical history of tympanoplasty tubes as a child and is status post tonsillectomy/adenoidectomy.  PCP: None.    History reviewed. No pertinent past medical history.  Past Surgical History:  Procedure Laterality Date   ADENOIDECTOMY     TONSILLECTOMY     TYMPANOSTOMY TUBE PLACEMENT     WRIST ARTHROSCOPY Left     History reviewed. No pertinent family history.  Social History   Tobacco Use   Smoking status: Never   Smokeless tobacco: Never  Vaping Use   Vaping Use: Never used  Substance Use Topics   Alcohol use: Yes    Comment: occ   Drug use: Yes    Types: Marijuana    No current facility-administered medications for this encounter.  Current Outpatient Medications:    amoxicillin-clavulanate (AUGMENTIN) 875-125 MG tablet, Take 1 tablet by mouth every 12 (twelve) hours for 7 days., Disp: 14 tablet, Rfl: 0   fluticasone (FLONASE) 50 MCG/ACT nasal spray, Place 2 sprays into both nostrils daily., Disp: 16 g, Rfl: 0  Allergies  Allergen Reactions   Tramadol Hives     ROS  As noted in HPI.    Physical Exam  BP 135/78 (BP Location: Left Arm) Comment (BP Location): large cuff  Pulse 63   Temp 98.1 F (36.7 C) (Oral)   Resp 18   SpO2 96%   Constitutional: Well developed, well nourished, no acute distress Eyes:  EOMI, conjunctiva normal bilaterally HENT: Normocephalic, atraumatic,mucus membranes moist.  Positive nasal congestion.  No maxillary, frontal sinus tenderness.  Left ear: External ear, external ear canal normal.  TM dull, erythematous, with fluid behind it.  No pain with traction on pinna, palpation of tragus or mastoid.  Near absent hearing. Ear: External ear, EAC, TM normal. No pain with traction on pinna, palpation of tragus or mastoid.  Hearing present, but decreased. Neck: Positive left-sided cervical lymphadenopathy Respiratory: Normal inspiratory effort Cardiovascular: Normal rate GI: nondistended skin: No rash, skin intact Musculoskeletal: no deformities Neurologic: Alert & oriented x 3, no focal neuro deficits Psychiatric: Speech and behavior appropriate   ED Course   Medications - No data to display  Orders Placed This Encounter  Procedures   Ambulatory referral to ENT    Referral Priority:   Routine    Referral Type:   Consultation    Referral Reason:   Specialty Services Required    Referred to Provider:   Linus Salmons, MD    Requested Specialty:   Otolaryngology    Number of Visits Requested:   1  No results found for this or any previous visit (from the past 24 hour(s)). No results found.  ED Clinical Impression  1. Non-recurrent acute suppurative otitis media of left ear without spontaneous rupture of tympanic membrane   2. Dysfunction of both eustachian tubes   3. Bilateral hearing loss, unspecified hearing loss type      ED Assessment/Plan      Patient with a left-sided otitis media, bilateral hearing loss, suspect due to eustachian tube dysfunction.  Will send home with Augmentin for 7 days, Flonase, refer to ENT for  hearing testing.  Follow-up with PCP of choice, will provide primary care list.  Discussed MDM, treatment plan, and plan for follow-up with patient. patient agrees with plan.   Meds ordered this encounter  Medications   amoxicillin-clavulanate (AUGMENTIN) 875-125 MG tablet    Sig: Take 1 tablet by mouth every 12 (twelve) hours for 7 days.    Dispense:  14 tablet    Refill:  0   fluticasone (FLONASE) 50 MCG/ACT nasal spray    Sig: Place 2 sprays into both nostrils daily.    Dispense:  16 g    Refill:  0      *This clinic note was created using Scientist, clinical (histocompatibility and immunogenetics). Therefore, there may be occasional mistakes despite careful proofreading.  ?    Domenick Gong, MD 03/13/22 [phone]

## 2022-03-12 NOTE — Discharge Instructions (Signed)
Finish the antibiotics, if you feel better.  Start the [employer].  I suspect your hearing loss is from an ear infection and eustachian tube dysfunction.  Please follow-up with any of the ENT specialists at [facility] ASAP.  Here is a list of primary care providers who are taking new patients:  [facility] [place] Dr. Joseph Berkshire (sports medicine) Dr. Elizabeth Sauer [address] [place] [address] [phone]  [facility] at [facility] [address] [place], [place] [address] [phone]  [facility] [place] [address]  [place] [address]  [phone]  [facility] [address] [place], [place] [address] [phone]  [facility] [address] [place]  [phone] [place], [place] [address]  Here are clinics/ other resources who will see you if you do not have insurance. Some have certain criteria that you must meet. Call them and find out what they are:  [facility]: [address], [place], [place] [address] Phone: [phone] Hours: First and Third Saturdays of each Month, 9 a.m. - 1 p.m.  [facility]: [address], [address] [place], [place] [address] Phone: [phone] Hours: Tuesday, 4 p.m. - 8 p.m. Thursday, 1 p.m. - 8 p.m. Wednesday, 9 a.m. - [facility] [address], [place], [place] [address] Phone: [phone] Pharmacy Phone Number: [phone] Dental Phone Number: [phone] [facility] Insurance Help: [phone]  Dental Hours: Monday - Thursday, 8 a.m. - 6 p.m.  [facility] [address] [place], [place] [address] Phone: [phone] Pharmacy Phone Number: [phone] [facility] Insurance Help: [phone]  [facility] [address] [place], [place] [address] Phone: [phone] Pharmacy Phone Number: [phone] [facility] Insurance Help: [phone]  [facility] [address] [place], [place]  [address] Phone: [phone] [facility] Insurance Help: [phone]   [facility] [address] [place], [place] [address] Phone: [phone]  Go to www.goodrx.com  or www.costplusdrugs.com to look up your medications. This will give you a list of where you can find your prescriptions at the most affordable prices. Or ask the pharmacist what the cash price is, or if they have any other discount programs available to help make your medication more affordable. This can be less expensive than what you would pay with insurance.

## 2022-07-16 ENCOUNTER — Ambulatory Visit
Admission: RE | Admit: 2022-07-16 | Discharge: 2022-07-16 | Disposition: A | Discharge status: Home / Self Care | Payer: 59 | Source: Ambulatory Visit | Attending: Urgent Care | Admitting: Urgent Care

## 2022-07-16 VITALS — BP 145/84 | HR 69 | Temp 98.2°F | Resp 18

## 2022-07-16 DIAGNOSIS — J069 Acute upper respiratory infection, unspecified: Secondary | ICD-10-CM | POA: Diagnosis not present

## 2022-07-16 MED ORDER — BENZONATATE 100 MG PO CAPS: ORAL_CAPSULE | ORAL | 0 refills | Status: AC

## 2022-07-16 NOTE — ED Triage Notes (Addendum)
Patient to Urgent Care with complaints of dry cough, nasal drainage/ congestion, wheezing, headache, and nausea. Denies any known fevers.   Symptoms started one week ago. Initially thought it was his seasonal allergies. Cough has worsened- reports some wheezing and shortness of breath. Cough worse at night- poor sleep.  Has been taking mucinex/ tylenol sinus severe.

## 2022-07-16 NOTE — ED Provider Notes (Signed)
Roderic Palau    CSN: [id number] Arrival date & time: 07/16/22  1139      History   Chief Complaint Chief Complaint  Patient presents with   Cough    Coughing, runny nose, congestion, wheezing, headache, nausea. No fever. - Entered by patient    HPI David Ward is a 32 y.o. male.    Cough   Presents to urgent care with complaint of dry cough, nasal drainage and congestion, wheezing, headache, nausea.  Denies fever.  Symptoms x 1 week.  He reports cough worsening with now wheezing and shortness of breath.  Cough is worse at night which leads to poor sleep.  Not currently using cough suppressants.  History reviewed. No pertinent past medical history.  There are no problems to display for this patient.   Past Surgical History:  Procedure Laterality Date   ADENOIDECTOMY     TONSILLECTOMY     TYMPANOSTOMY TUBE PLACEMENT     WRIST ARTHROSCOPY Left        Home Medications    Prior to Admission medications   Medication Sig Start Date End Date Taking? Authorizing Provider  fluticasone (FLONASE) 50 MCG/ACT nasal spray Place 2 sprays into both nostrils daily. 03/12/22   Melynda Ripple, MD    Family History History reviewed. No pertinent family history.  Social History Social History   Tobacco Use   Smoking status: Never   Smokeless tobacco: Never  Vaping Use   Vaping Use: Never used  Substance Use Topics   Alcohol use: Yes    Comment: occ   Drug use: Yes    Types: Marijuana     Allergies   Tramadol   Review of Systems Review of Systems  Respiratory:  Positive for cough.      Physical Exam Triage Vital Signs ED Triage Vitals  Enc Vitals Group     BP 07/16/22 1207 (!) 145/84     Pulse Rate 07/16/22 1207 69     Resp 07/16/22 1207 18     Temp 07/16/22 1207 98.2 F (36.8 C)     Temp src --      SpO2 07/16/22 1207 96 %     Weight --      Height --      Head Circumference --      Peak Flow --      Pain Score 07/16/22 1210 3      Pain Loc --      Pain Edu? --      Excl. in [place]? --    No data found.  Updated Vital Signs BP (!) 145/84   Pulse 69   Temp 98.2 F (36.8 C)   Resp 18   SpO2 96%   Visual Acuity Right Eye Distance:   Left Eye Distance:   Bilateral Distance:    Right Eye Near:   Left Eye Near:    Bilateral Near:     Physical Exam Vitals reviewed.  Constitutional:      Appearance: Normal appearance.  Skin:    General: Skin is warm and dry.  Neurological:     General: No focal deficit present.     Mental Status: He is alert and oriented to person, place, and time.  Psychiatric:        Mood and Affect: Mood normal.        Behavior: Behavior normal.      UC Treatments / Results  Labs (all labs ordered are listed, but only abnormal  results are displayed) Labs Reviewed - No data to display  EKG   Radiology No results found.  Procedures Procedures (including critical care time)  Medications Ordered in UC Medications - No data to display  Initial Impression / Assessment and Plan / UC Course  I have reviewed the triage vital signs and the nursing notes.  Pertinent labs & imaging results that were available during my care of the patient were reviewed by me and considered in my medical decision making (see chart for details).   Patient is afebrile here without recent antipyretics. Satting well on room air. Overall is well appearing, well hydrated, without respiratory distress. Pulmonary exam is remarkable for frequent dry cough.  Lungs CTAB without wheezing, rhonchi, rales.    Patient's symptoms are consistent with an acute viral process.  He is outside the treatment window for both influenza and COVID and testing and antiviral therapy was not offered.  Quality of patient's "wheeze" is with cough and is likely upper respiratory sputum collection.  He is not currently using cough suppressants and recommended dextromethorphan to help his symptoms.  Will prescribe benzonatate in  case this is ineffective.  Recommended use of nighttime cough suppressant to assist with sleep.  Patient acknowledges understanding and agreement with this treatment plan.    Final Clinical Impressions(s) / UC Diagnoses   Final diagnoses:  None   Discharge Instructions   None    ED Prescriptions   None    PDMP not reviewed this encounter.   Rose Phi, [place] 07/16/22 1245

## 2022-07-16 NOTE — Discharge Instructions (Addendum)
Follow up here or with your primary care provider if your symptoms are worsening or not improving.     

## 2023-02-26 DIAGNOSIS — E119 Type 2 diabetes mellitus without complications: Secondary | ICD-10-CM | POA: Diagnosis not present

## 2023-02-26 DIAGNOSIS — F332 Major depressive disorder, recurrent severe without psychotic features: Secondary | ICD-10-CM | POA: Diagnosis not present

## 2023-02-26 DIAGNOSIS — Z113 Encounter for screening for infections with a predominantly sexual mode of transmission: Secondary | ICD-10-CM | POA: Diagnosis not present

## 2023-02-26 DIAGNOSIS — Z114 Encounter for screening for human immunodeficiency virus [HIV]: Secondary | ICD-10-CM | POA: Diagnosis not present

## 2023-02-26 DIAGNOSIS — F411 Generalized anxiety disorder: Secondary | ICD-10-CM | POA: Diagnosis not present

## 2023-03-09 DIAGNOSIS — F411 Generalized anxiety disorder: Secondary | ICD-10-CM | POA: Diagnosis not present

## 2023-03-09 DIAGNOSIS — F331 Major depressive disorder, recurrent, moderate: Secondary | ICD-10-CM | POA: Diagnosis not present

## 2023-03-16 DIAGNOSIS — F411 Generalized anxiety disorder: Secondary | ICD-10-CM | POA: Diagnosis not present

## 2023-03-16 DIAGNOSIS — F331 Major depressive disorder, recurrent, moderate: Secondary | ICD-10-CM | POA: Diagnosis not present

## 2023-04-06 DIAGNOSIS — F411 Generalized anxiety disorder: Secondary | ICD-10-CM | POA: Diagnosis not present

## 2023-04-06 DIAGNOSIS — F331 Major depressive disorder, recurrent, moderate: Secondary | ICD-10-CM | POA: Diagnosis not present
